# Patient Record
Sex: Male | Born: 1945 | Race: White | Hispanic: No | Marital: Married
Health system: Southern US, Community
[De-identification: ages and names within clinical notes are randomized; demographics above are authoritative.]

---

## 2013-01-29 ENCOUNTER — Other Ambulatory Visit (HOSPITAL_COMMUNITY): Payer: Self-pay | Admitting: Family Medicine

## 2013-01-29 DIAGNOSIS — R109 Unspecified abdominal pain: Secondary | ICD-10-CM

## 2013-02-02 ENCOUNTER — Ambulatory Visit (HOSPITAL_COMMUNITY)
Admission: RE | Admit: 2013-02-02 | Discharge: 2013-02-02 | Disposition: A | Discharge status: Home / Self Care | Payer: Medicare Other | Source: Ambulatory Visit | Attending: Family Medicine | Admitting: Family Medicine

## 2013-02-02 DIAGNOSIS — K7689 Other specified diseases of liver: Secondary | ICD-10-CM | POA: Insufficient documentation

## 2013-02-02 DIAGNOSIS — R109 Unspecified abdominal pain: Secondary | ICD-10-CM | POA: Insufficient documentation

## 2013-08-06 ENCOUNTER — Other Ambulatory Visit (HOSPITAL_COMMUNITY): Payer: Self-pay | Admitting: Family Medicine

## 2013-08-06 DIAGNOSIS — R42 Dizziness and giddiness: Secondary | ICD-10-CM

## 2013-08-10 ENCOUNTER — Encounter (HOSPITAL_COMMUNITY): Payer: Self-pay

## 2013-08-10 ENCOUNTER — Ambulatory Visit (HOSPITAL_COMMUNITY)
Admission: RE | Admit: 2013-08-10 | Discharge: 2013-08-10 | Disposition: A | Discharge status: Home / Self Care | Payer: Medicare Other | Source: Ambulatory Visit | Attending: Family Medicine | Admitting: Family Medicine

## 2013-08-10 DIAGNOSIS — R42 Dizziness and giddiness: Secondary | ICD-10-CM | POA: Diagnosis not present

## 2015-01-09 IMAGING — US US ABDOMEN COMPLETE
1 series · 14 of 25 positions shown · non-contrast
Comparison: None.

CLINICAL DATA: Abdominal pain

EXAM:
ULTRASOUND ABDOMEN COMPLETE

[Series 1: us abdomen complete · 0.25mm/px · 80 acquisitions, 14 frames shown]
[im 1/80]
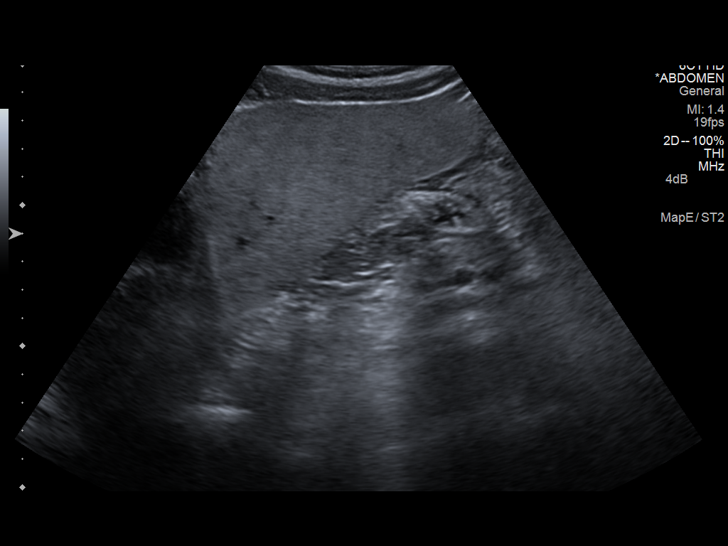
[im 7/80]
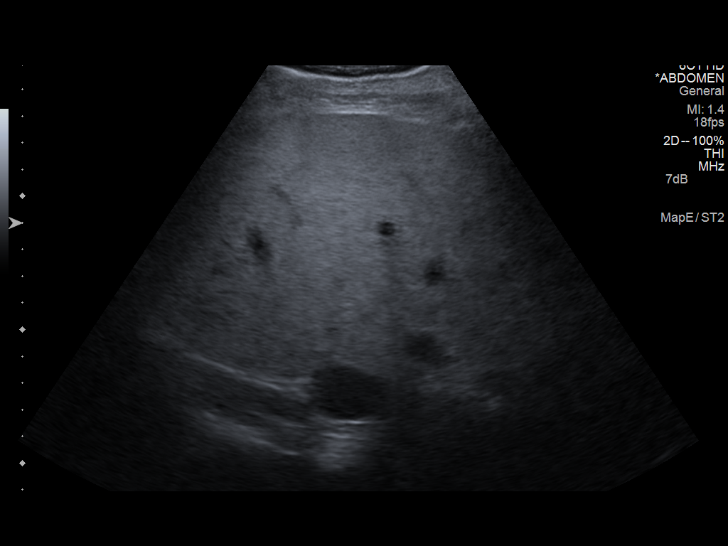
[im 14/80]
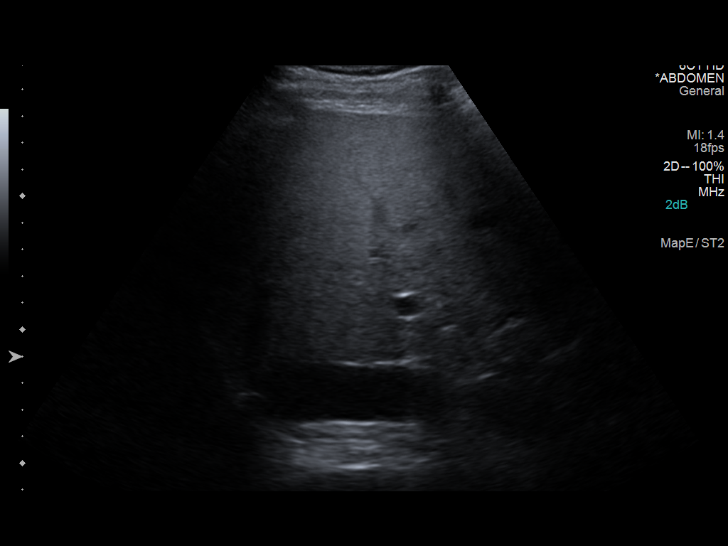
[im 20/80]
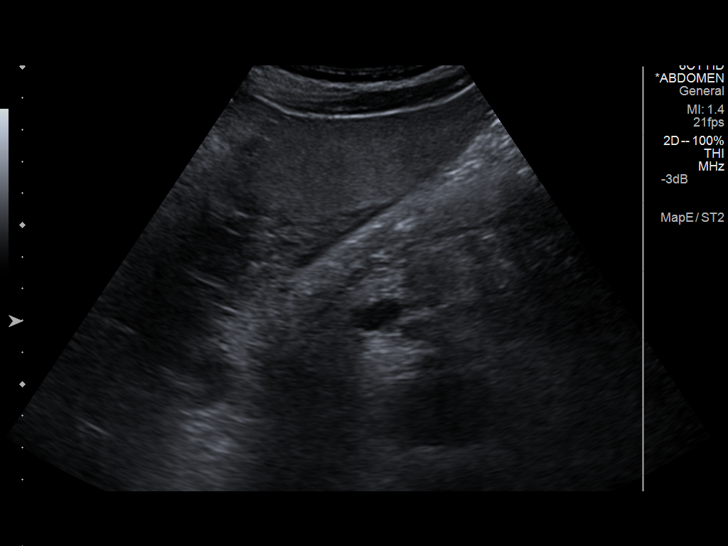
[im 27/80]
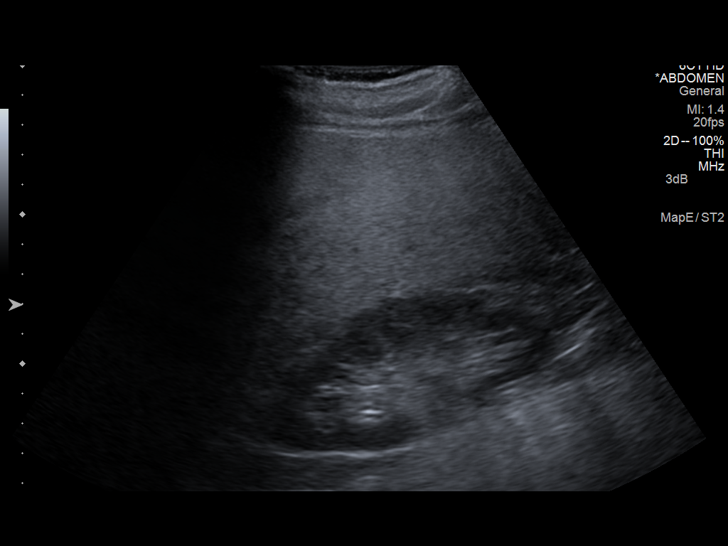
[im 30/80]
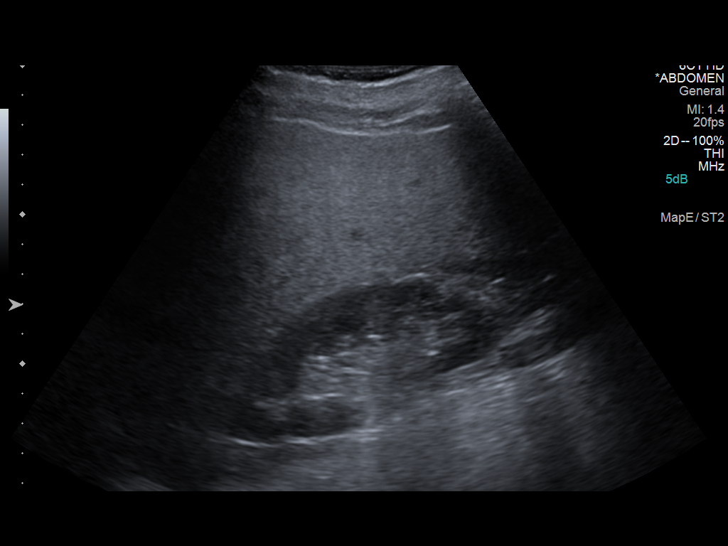
[im 37/80]
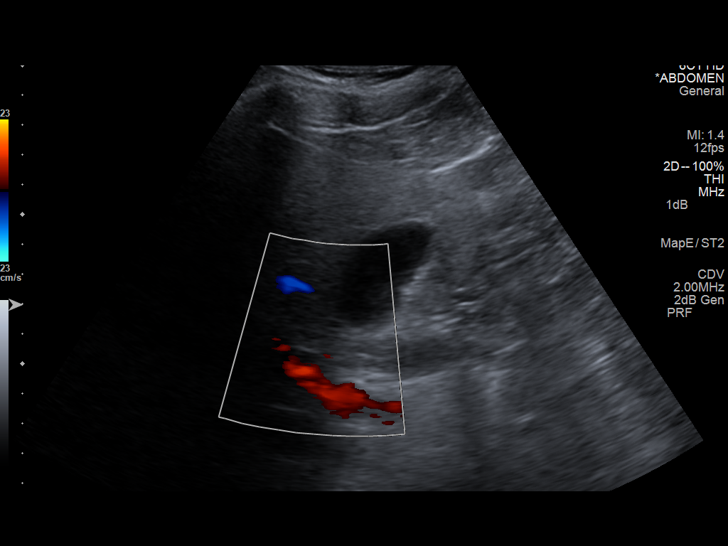
[im 43/80]
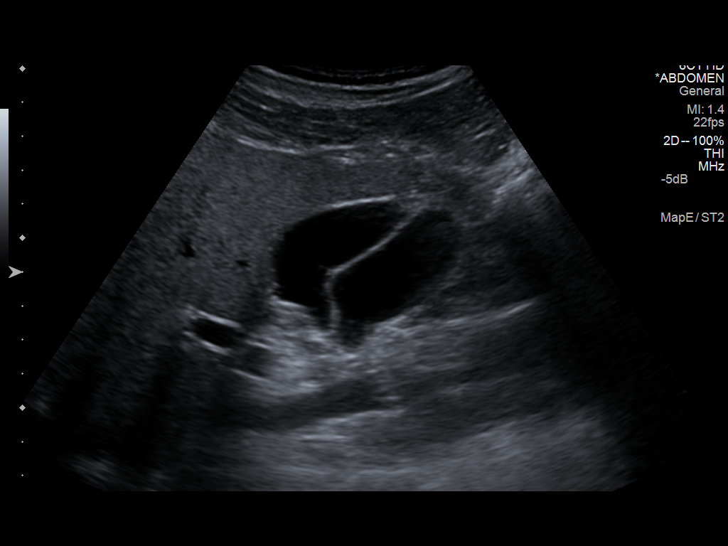
[im 50/80]
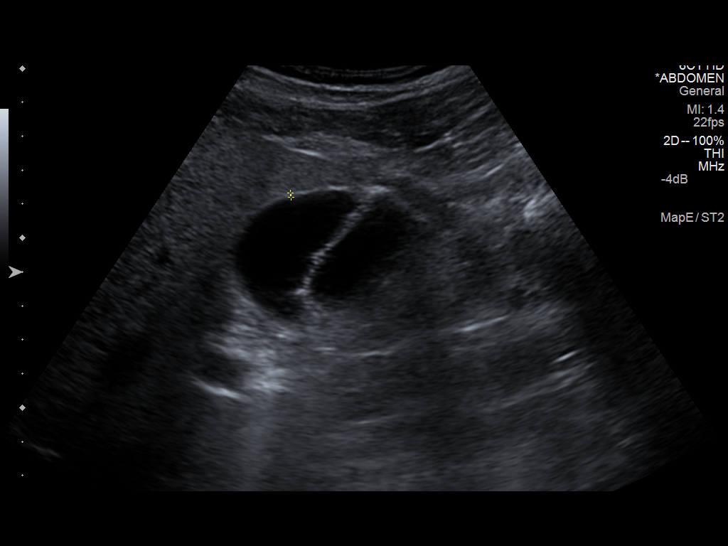
[im 53/80]
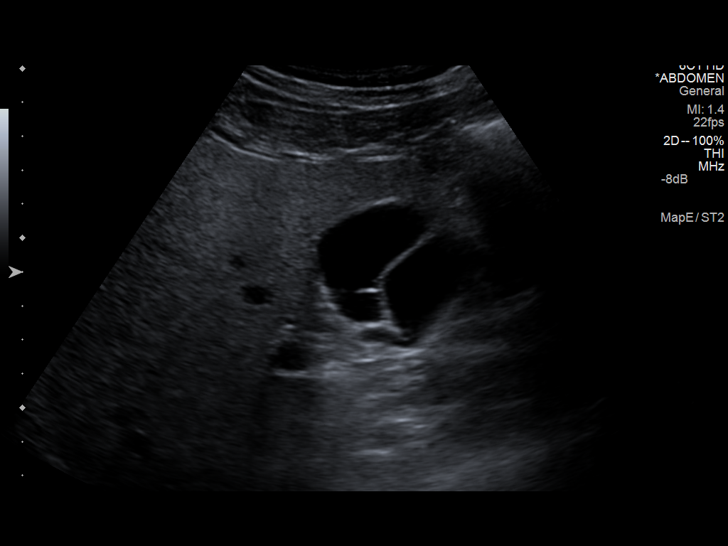
[im 60/80]
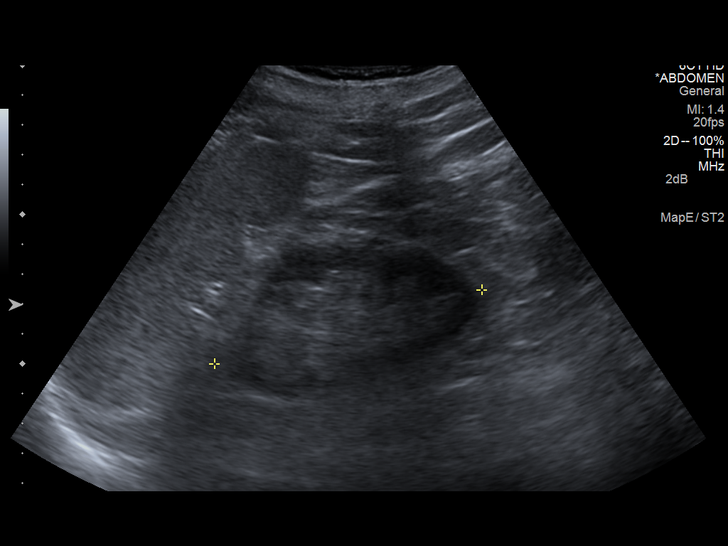
[im 66/80]
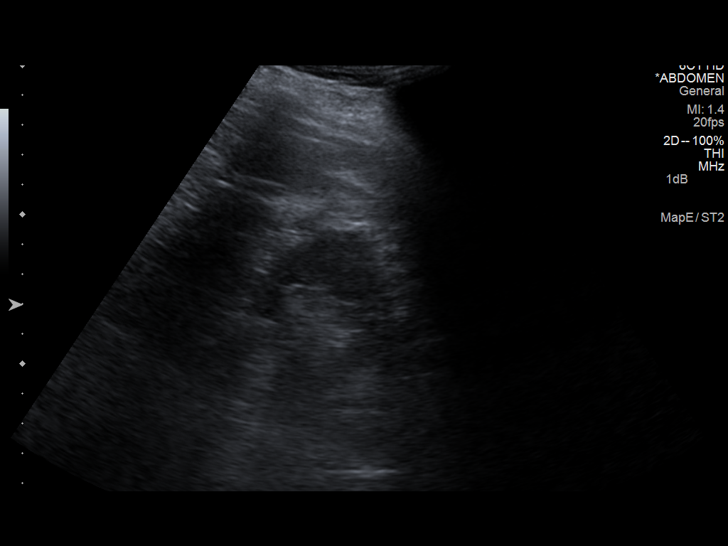
[im 73/80]
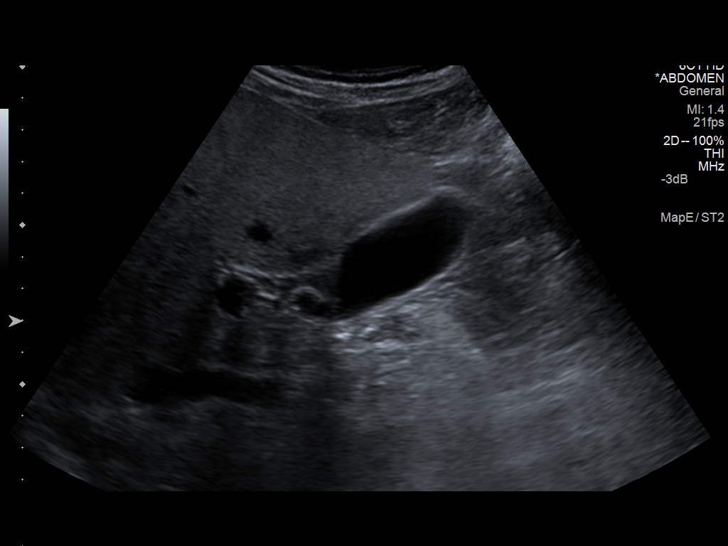
[im 80/80]
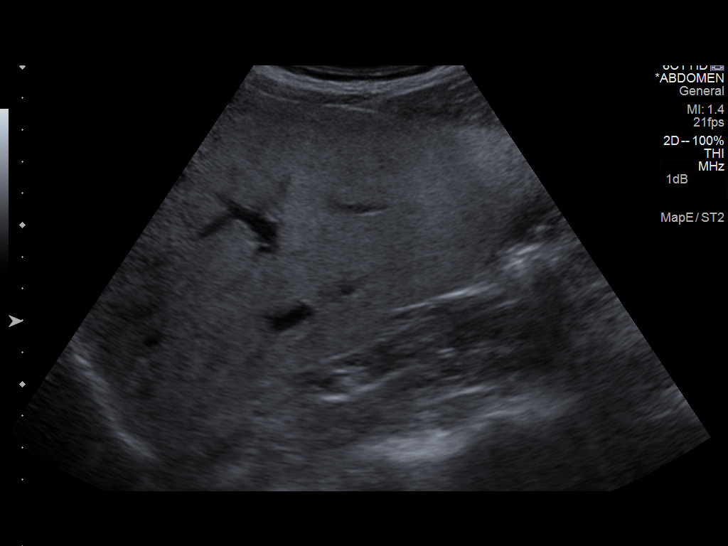

[14 of 25 positions shown; findings below may reference images not displayed]

FINDINGS: Gallbladder:

No gallstones or wall thickening visualized. There is a thin
septation along the gallbladder fundus with what appears to be a
common lumen along the gallbladder neck. This may reflect a septate
versus bilobed gallbladder. No sonographic Murphy sign noted.

Common bile duct:

Diameter: 3.9 mm

Liver:

No focal hepatic lesion. The liver is increased in echogenicity as
can be seen with hepatic steatosis.

IVC:

No abnormality visualized.

Pancreas:

Visualized portion unremarkable.

Spleen:

Size and appearance within normal limits.

Right Kidney:

Length: 10.4 cm. Echogenicity within normal limits. No mass or
hydronephrosis visualized.

Left Kidney:

Length: 9.9 cm. Echogenicity within normal limits. No mass or
hydronephrosis visualized.

Abdominal aorta:

No aneurysm visualized.

Other findings:

None.
IMPRESSION: 1. No cholelithiasis or sonographic evidence of acute cholecystitis.
2. Septate versus a bilobed gallbladder. Septate gallbladder is
favored.
3. Hepatic steatosis.

## 2015-07-17 IMAGING — CT CT HEAD W/O CM
1 series · 16 of 30 positions shown, 20 images · non-contrast
Comparison: None.

CLINICAL DATA: Dizziness

EXAM:
CT HEAD WITHOUT CONTRAST
TECHNIQUE: Contiguous axial images were obtained from the base of the skull
through the vertex without intravenous contrast.

[Series 2: headseq 4.8 [id number] · axial · 0.43mm/px · z∈[-47,+83]mm · 16 of 30 slices shown, 20 images]
[im 2/30  brain]
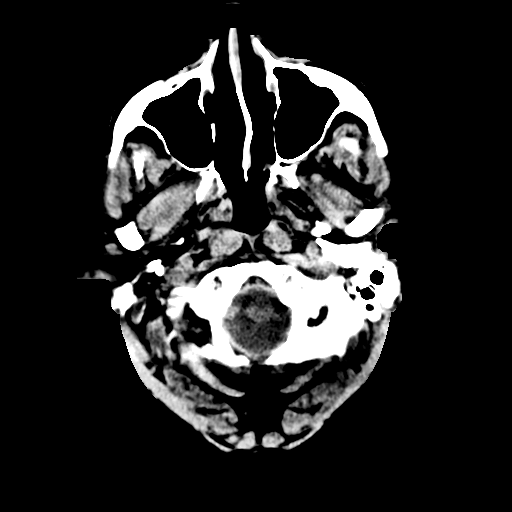
[im 2/30  bone]
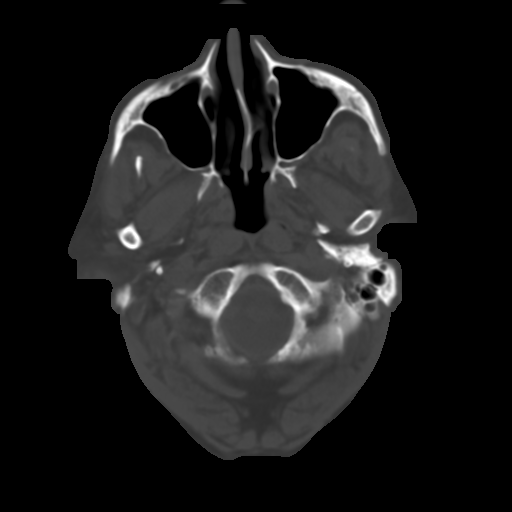
[im 4/30  brain]
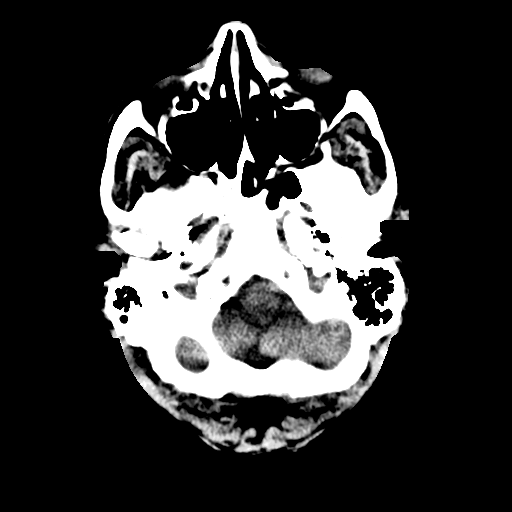
[im 6/30  brain]
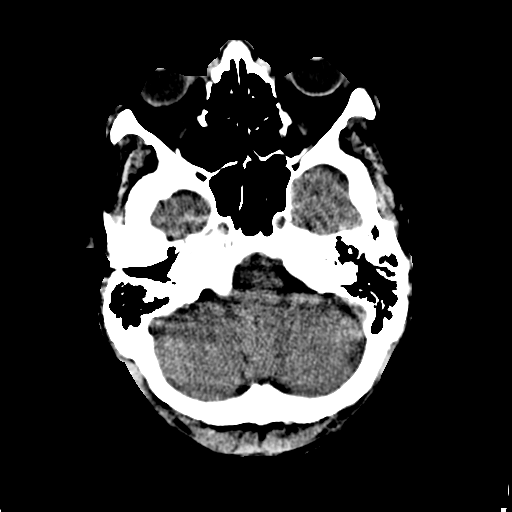
[im 8/30  brain]
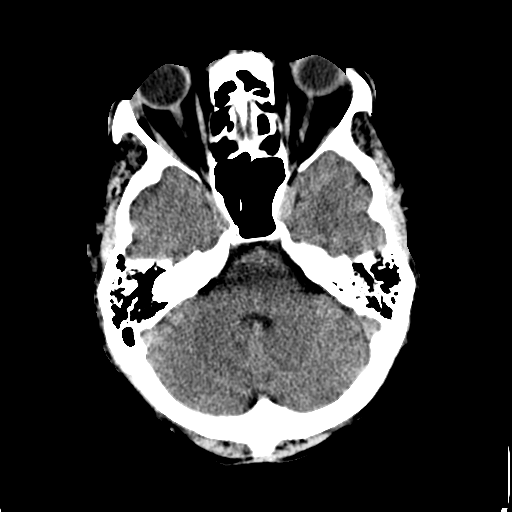
[im 9/30  brain]
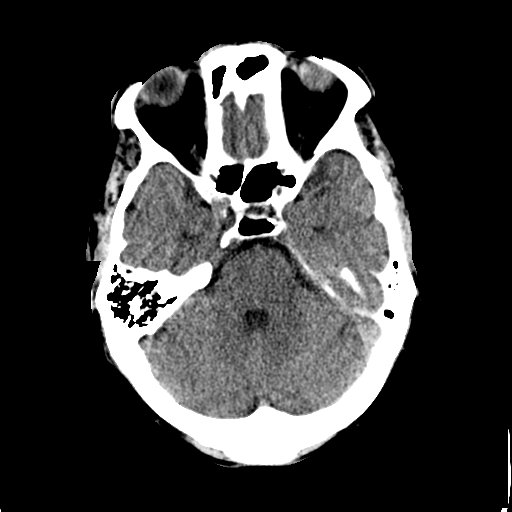
[im 9/30  bone]
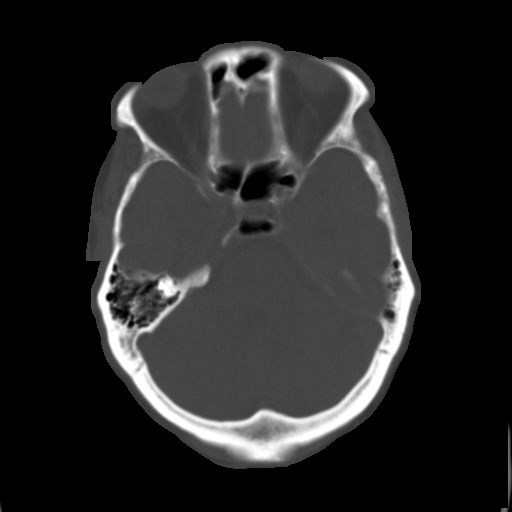
[im 11/30  brain]
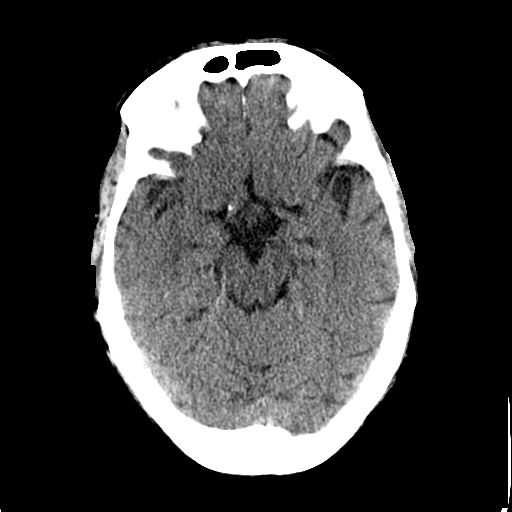
[im 13/30  brain]
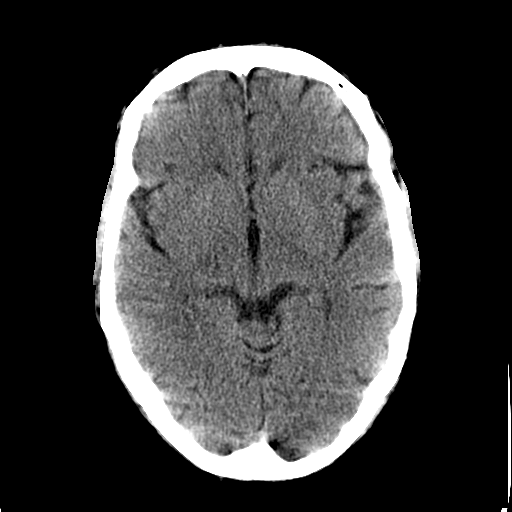
[im 15/30  brain]
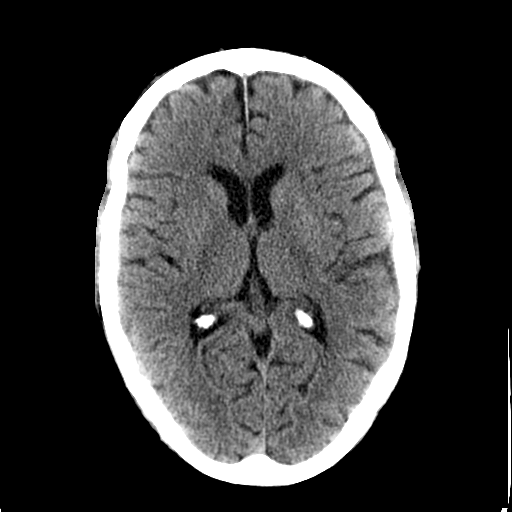
[im 16/30  brain]
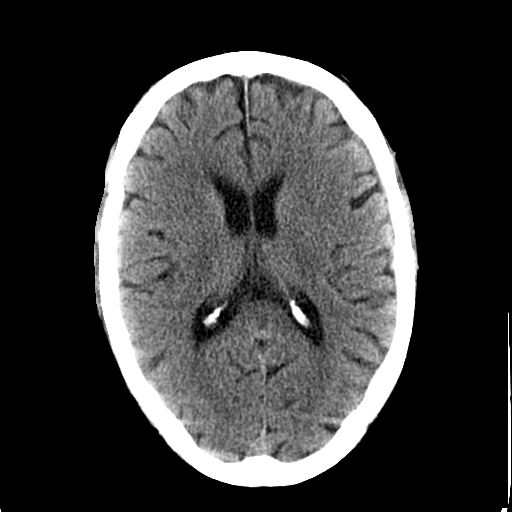
[im 16/30  bone]
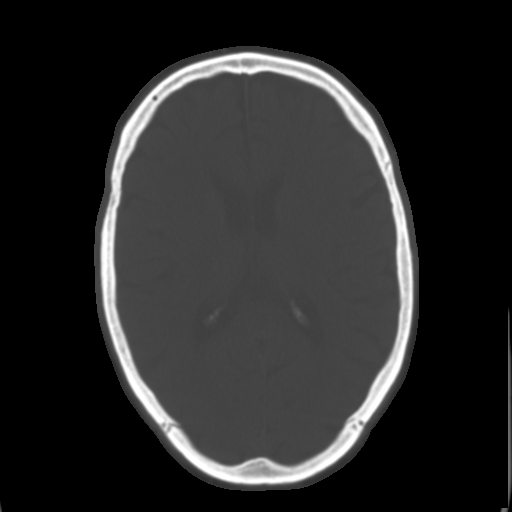
[im 18/30  brain]
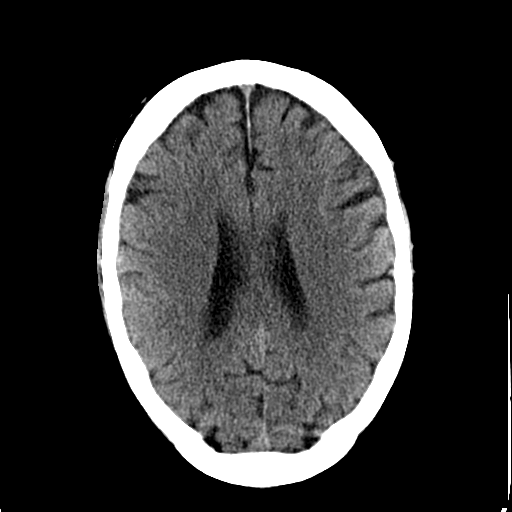
[im 20/30  brain]
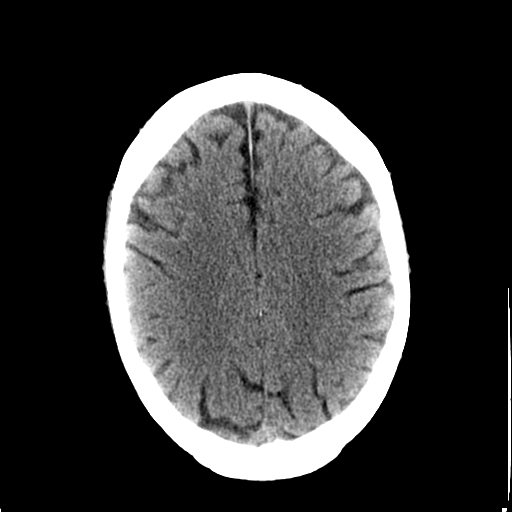
[im 22/30  brain]
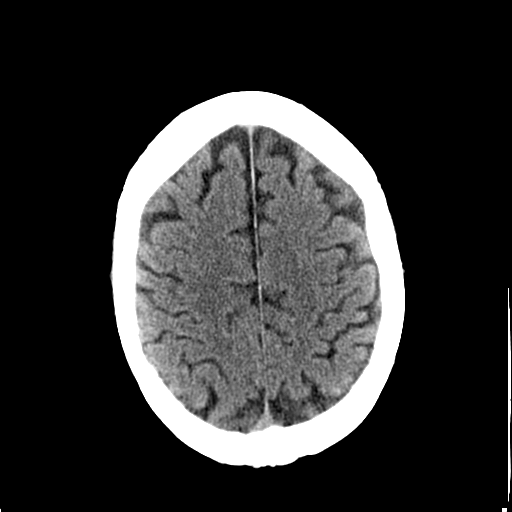
[im 23/30  brain]
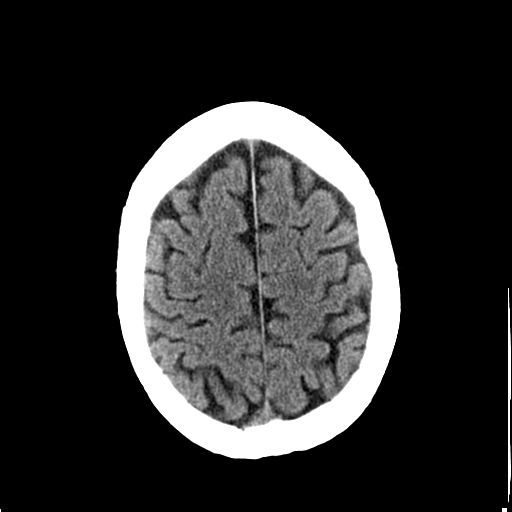
[im 23/30  bone]
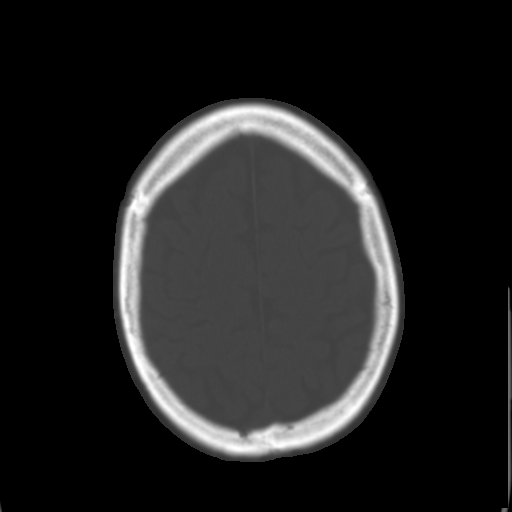
[im 25/30  brain]
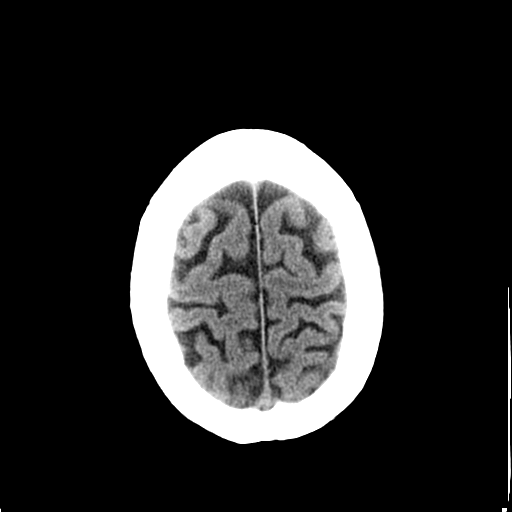
[im 27/30  brain]
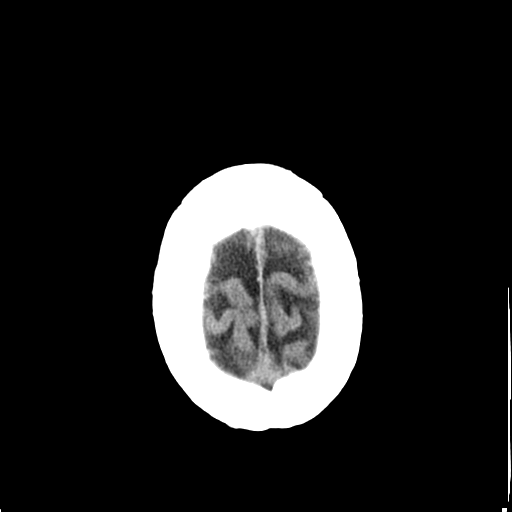
[im 29/30  brain]
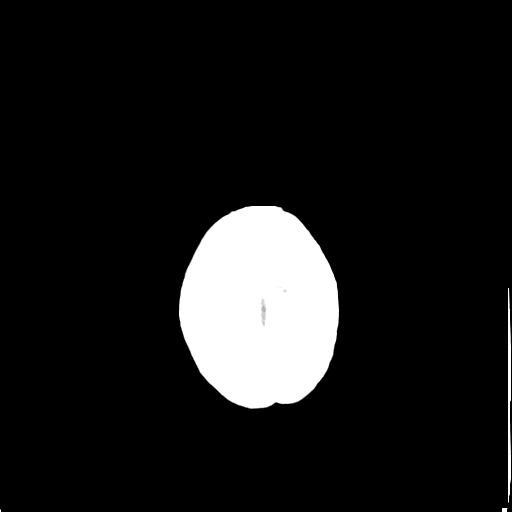

[16 of 30 positions shown; findings below may reference images not displayed]

FINDINGS: Negative for acute intracranial hemorrhage, acute infarction, mass,
mass effect, hydrocephalus or midline shift. Gray-white
differentiation is preserved throughout. No acute soft tissue or
calvarial abnormality. The globes and orbits are symmetric and
unremarkable. Normal aeration of the mastoid air cells and
visualized paranasal sinuses.
IMPRESSION: Negative head CT.

## 2021-10-25 DEATH — deceased
